# Patient Record
Sex: Female | Born: 2000 | Race: Black or African American | Hispanic: No | Marital: Single | Smoking: Never smoker
Health system: Southern US, Community
[De-identification: ages and names within clinical notes are randomized; demographics above are authoritative.]

## PROBLEM LIST (undated history)

## (undated) DIAGNOSIS — G43909 Migraine, unspecified, not intractable, without status migrainosus: Secondary | ICD-10-CM

## (undated) HISTORY — PX: NO PAST SURGERIES: SHX2092

## (undated) HISTORY — PX: MOUTH SURGERY: SHX715

## (undated) HISTORY — DX: Migraine, unspecified, not intractable, without status migrainosus: G43.909

---
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
## 2020-01-18 ENCOUNTER — Other Ambulatory Visit: Payer: Self-pay

## 2020-01-18 ENCOUNTER — Ambulatory Visit (HOSPITAL_COMMUNITY)
Admission: EM | Admit: 2020-01-18 | Discharge: 2020-01-18 | Disposition: A | Discharge status: Home / Self Care | Payer: Self-pay | Attending: Family Medicine | Admitting: Family Medicine

## 2020-01-18 ENCOUNTER — Encounter (HOSPITAL_COMMUNITY): Payer: Self-pay

## 2020-01-18 DIAGNOSIS — S01511A Laceration without foreign body of lip, initial encounter: Secondary | ICD-10-CM

## 2020-01-18 DIAGNOSIS — S0993XA Unspecified injury of face, initial encounter: Secondary | ICD-10-CM

## 2020-01-18 MED ORDER — PENICILLIN V POTASSIUM 500 MG PO TABS: 500.0000 mg | ORAL_TABLET | Freq: Four times a day (QID) | ORAL | 0 refills | Status: AC

## 2020-01-18 MED ORDER — TRAMADOL-ACETAMINOPHEN 37.5-325 MG PO TABS: 1.0000 | ORAL_TABLET | Freq: Four times a day (QID) | ORAL | 0 refills | Status: DC | PRN

## 2020-01-18 NOTE — Discharge Instructions (Signed)
Continue salt water gargles Consider anbesol for teething pain ( numbing gel)  Take the antibiotic 4 times a day until dentist tells you to stop Take the pain medicine as needed Do not drive on the [place]
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
## 2020-03-29 ENCOUNTER — Telehealth (HOSPITAL_COMMUNITY): Payer: Self-pay | Admitting: Emergency Medicine

## 2020-03-29 ENCOUNTER — Encounter (HOSPITAL_COMMUNITY): Payer: Self-pay | Admitting: Emergency Medicine

## 2020-03-29 ENCOUNTER — Other Ambulatory Visit: Payer: Self-pay

## 2020-03-29 ENCOUNTER — Ambulatory Visit (HOSPITAL_COMMUNITY)
Admission: EM | Admit: 2020-03-29 | Discharge: 2020-03-29 | Disposition: A | Discharge status: Home / Self Care | Payer: Medicaid Other | Attending: Family Medicine | Admitting: Family Medicine

## 2020-03-29 DIAGNOSIS — J069 Acute upper respiratory infection, unspecified: Secondary | ICD-10-CM

## 2020-03-29 MED ORDER — AZITHROMYCIN 250 MG PO TABS: 250.0000 mg | ORAL_TABLET | Freq: Every day | ORAL | 0 refills | Status: AC

## 2020-03-29 MED ORDER — FLUTICASONE PROPIONATE 50 MCG/ACT NA SUSP
2.0000 | Freq: Every day | NASAL | 0 refills | Status: DC
Start: 2020-03-29 — End: 2023-10-03

## 2020-03-29 NOTE — ED Triage Notes (Signed)
Onset of symptoms 3 days ago.  Symptoms consist of itchy throat, ears stuffy, eyes watery, headache-headache is on right side of head and behind right eye.  Patient has a productive cough, yellow phlegm  Denies fever

## 2020-03-29 NOTE — ED Notes (Signed)
Computer/epic glitch

## 2020-03-29 NOTE — Discharge Instructions (Signed)
Please try the flonase initially.  If your symptoms fail to improve then try the azithromycin  Please try the zyrtec as well.  Please follow up if your symptoms fail to improve.

## 2020-03-29 NOTE — ED Provider Notes (Signed)
[facility]    CSN: [id number] Arrival date & time: 03/29/20  1652      History   Chief Complaint Chief Complaint  Patient presents with  . URI    HPI Katie Le is a 19 y.o. female.   Presenting with bilateral ear pain and itchy watery eyes and sinus congestion.  Symptoms been ongoing for a few days.  Feels different than her normal allergies.  Has not been running with been sick.  No fevers.  HPI  History reviewed. No pertinent past medical history.  Patient Active Problem List   Diagnosis Date Noted  . Tension headache 06/09/2018  . Migraine without aura and without status migrainosus, not intractable 06/09/2018  . Allergic urticaria 08/02/2016  . Allergic rhinoconjunctivitis 08/02/2016    Past Surgical History:  Procedure Laterality Date  . MOUTH SURGERY    . NO PAST SURGERIES      OB History   No obstetric history on file.      Home Medications    Prior to Admission medications   Medication Sig Start Date End Date Taking? Authorizing Provider  cetirizine (ZYRTEC) 10 MG tablet Take one tablet one to two times per day as directed. 05/27/18  Yes Wieters, Hallie C, PA-C  guaiFENesin (MUCINEX) 600 MG 12 hr tablet Take by mouth 2 (two) times daily.   Yes [provider]  azithromycin (ZITHROMAX) 250 MG tablet Take 1 tablet (250 mg total) by mouth daily. Take first 2 tablets together, then 1 every day until finished. 03/29/20   Myra Rude, MD  EPINEPHrine 0.3 mg/0.3 mL IJ SOAJ injection Use as directed for life-threatening allergic reaction. 11/13/16   Marcelyn Bruins, MD  fluticasone (FLONASE) 50 MCG/ACT nasal spray Place 2 sprays into both nostrils daily. 03/29/20 03/29/21  Myra Rude, MD  montelukast (SINGULAIR) 10 MG tablet TK 1 T PO QD 11/13/16   [provider]  traMADol-acetaminophen (ULTRACET) 37.5-325 MG tablet Take 1-2 tablets by mouth every 6 (six) hours as needed. 01/18/20   Eustace Moore, MD  topiramate  (TOPAMAX) 50 MG tablet Take 1 tablet (50 mg total) by mouth at bedtime. (Start with half a tablet every night for the first week) 06/09/18 01/18/20  Keturah Shavers, MD    Family History Family History  Problem Relation Age of Onset  . Eczema Father   . Urticaria Father   . Asthma Maternal Grandmother   . Allergic rhinitis Mother   . Migraines Mother   . Angioedema Neg Hx   . Immunodeficiency Neg Hx     Social History Social History   Tobacco Use  . Smoking status: Never Smoker  . Smokeless tobacco: Never Used  Vaping Use  . Vaping Use: Never used  Substance Use Topics  . Alcohol use: Yes  . Drug use: Yes    Types: Marijuana     Allergies   Patient has no known allergies.   Review of Systems Review of Systems  See HPI  Physical Exam Triage Vital Signs ED Triage Vitals  Enc Vitals Group     BP 03/29/20 1750 116/81     Pulse Rate 03/29/20 1750 77     Resp 03/29/20 1750 18     Temp 03/29/20 1750 98.2 F (36.8 C)     Temp src --      SpO2 03/29/20 1750 97 %     Weight --      Height --      Head Circumference --  Peak Flow --      Pain Score 03/29/20 1728 7     Pain Loc --      Pain Edu? --      Excl. in [place]? --    No data found.  Updated Vital Signs BP 116/81 (BP Location: Right Arm)   Pulse 77   Temp 98.2 F (36.8 C)   Resp 18   SpO2 97%   Visual Acuity Right Eye Distance:   Left Eye Distance:   Bilateral Distance:    Right Eye Near:   Left Eye Near:    Bilateral Near:     Physical Exam Gen: NAD, alert, cooperative with exam, well-appearing ENT: normal lips, normal nasal mucosa, tympanic membranes clear and intact bilaterally, normal oropharynx, no cervical lymphadenopathy Eye: normal EOM, normal conjunctiva and lids CV:  +2 pedal pulses, regular rate and rhythm, S1-S2   Resp: no accessory muscle use, non-labored, clear to auscultation bilaterally, Psych:  normal insight, alert and oriented MSK: Normal gait, normal strength    UC  Treatments / Results  Labs (all labs ordered are listed, but only abnormal results are displayed) Labs Reviewed - No data to display  EKG   Radiology No results found.  Procedures Procedures (including critical care time)  Medications Ordered in UC Medications - No data to display  Initial Impression / Assessment and Plan / UC Course  I have reviewed the triage vital signs and the nursing notes.  Pertinent labs & imaging results that were available during my care of the patient were reviewed by me and considered in my medical decision making (see chart for details).     Katie Le is an 19 year old female is presenting with symptoms suggestive of upper respiratory infection.  Friend with Flonase and counseled on supportive care.  Given an azithromycin if symptoms fail to improve after few days.  Given indications to follow-up.  Final Clinical Impressions(s) / UC Diagnoses   Final diagnoses:  Upper respiratory tract infection, unspecified type     Discharge Instructions     Please try the flonase initially.  If your symptoms fail to improve then try the azithromycin  Please try the zyrtec as well.  Please follow up if your symptoms fail to improve.     ED Prescriptions    Medication Sig Dispense Auth. Provider   fluticasone (FLONASE) 50 MCG/ACT nasal spray Place 2 sprays into both nostrils daily. 1 g Myra Rude, MD   azithromycin (ZITHROMAX) 250 MG tablet Take 1 tablet (250 mg total) by mouth daily. Take first 2 tablets together, then 1 every day until finished. 6 tablet Myra Rude, MD     PDMP not reviewed this encounter.   Myra Rude, MD 03/29/20 [phone]
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
## 2020-04-01 ENCOUNTER — Ambulatory Visit (INDEPENDENT_AMBULATORY_CARE_PROVIDER_SITE_OTHER): Payer: Medicaid Other

## 2020-04-01 ENCOUNTER — Encounter (HOSPITAL_COMMUNITY): Payer: Self-pay

## 2020-04-01 ENCOUNTER — Ambulatory Visit (HOSPITAL_COMMUNITY)
Admission: EM | Admit: 2020-04-01 | Discharge: 2020-04-01 | Disposition: A | Discharge status: Home / Self Care | Payer: Medicaid Other | Attending: Family Medicine | Admitting: Family Medicine

## 2020-04-01 ENCOUNTER — Other Ambulatory Visit: Payer: Self-pay

## 2020-04-01 DIAGNOSIS — R109 Unspecified abdominal pain: Secondary | ICD-10-CM

## 2020-04-01 DIAGNOSIS — R05 Cough: Secondary | ICD-10-CM

## 2020-04-01 DIAGNOSIS — Z3202 Encounter for pregnancy test, result negative: Secondary | ICD-10-CM | POA: Diagnosis not present

## 2020-04-01 LAB — POCT URINALYSIS DIP (DEVICE)
Bilirubin Urine: NEGATIVE
Glucose, UA: NEGATIVE mg/dL
Hgb urine dipstick: NEGATIVE
Ketones, ur: NEGATIVE mg/dL
Nitrite: NEGATIVE
Protein, ur: NEGATIVE mg/dL
Specific Gravity, Urine: 1.025 (ref 1.005–1.030)
Urobilinogen, UA: 0.2 mg/dL (ref 0.0–1.0)
pH: 6.5 (ref 5.0–8.0)

## 2020-04-01 LAB — POC URINE PREG, ED: Preg Test, Ur: NEGATIVE

## 2020-04-01 MED ORDER — NAPROXEN 500 MG PO TABS
500.0000 mg | ORAL_TABLET | Freq: Two times a day (BID) | ORAL | 0 refills | Status: AC
Start: 2020-04-01 — End: ?

## 2020-04-01 MED ORDER — PSEUDOEPH-BROMPHEN-DM 30-2-10 MG/5ML PO SYRP
5.0000 mL | ORAL_SOLUTION | Freq: Four times a day (QID) | ORAL | 0 refills | Status: DC | PRN
Start: 2020-04-01 — End: 2021-06-14

## 2020-04-01 NOTE — Discharge Instructions (Addendum)
Xray normal, urine normal I suspect pain likely muscle straining/inflammation from frequent coughing May try cough syrup as needed to help with cough Naprosyn twice daily for pain Continue to monitor and follow up if not improving or worsening

## 2020-04-01 NOTE — ED Provider Notes (Signed)
[facility]    CSN: [id number] Arrival date & time: 04/01/20  1038      History   Chief Complaint Chief Complaint  Patient presents with  . Flank Pain    HPI Katie Le is a 19 y.o. female no significant past medical history presenting today for evaluation of right side pain.  Patient reports that beginning yesterday she began to have discomfort in her side.  Has had increased pain with certain movements, positions as well as inspiration.  She has had URI symptoms x1 week with associated cough.  Symptoms worse with cough, denies recent worsening of URI symptoms or cough.  Denies fevers.  She denies abdominal pain nausea or vomiting.  Denies any injury or trauma.  Patient expresses concern over needing possible x-ray.  HPI  History reviewed. No pertinent past medical history.  Patient Active Problem List   Diagnosis Date Noted  . Tension headache 06/09/2018  . Migraine without aura and without status migrainosus, not intractable 06/09/2018  . Allergic urticaria 08/02/2016  . Allergic rhinoconjunctivitis 08/02/2016    Past Surgical History:  Procedure Laterality Date  . MOUTH SURGERY    . NO PAST SURGERIES      OB History   No obstetric history on file.      Home Medications    Prior to Admission medications   Medication Sig Start Date End Date Taking? Authorizing Provider  azithromycin (ZITHROMAX) 250 MG tablet Take 1 tablet (250 mg total) by mouth daily. Take first 2 tablets together, then 1 every day until finished. 03/29/20   Myra Rude, MD  brompheniramine-pseudoephedrine-DM 30-2-10 MG/5ML syrup Take 5 mLs by mouth 4 (four) times daily as needed (cough/congestion). 04/01/20   Nash Bolls C, PA-C  cetirizine (ZYRTEC) 10 MG tablet Take one tablet one to two times per day as directed. 05/27/18   Theia Dezeeuw C, PA-C  EPINEPHrine 0.3 mg/0.3 mL IJ SOAJ injection Use as directed for life-threatening allergic reaction. 11/13/16   Marcelyn Bruins, MD  fluticasone (FLONASE) 50 MCG/ACT nasal spray Place 2 sprays into both nostrils daily. 03/29/20 03/29/21  Myra Rude, MD  guaiFENesin (MUCINEX) 600 MG 12 hr tablet Take by mouth 2 (two) times daily.    [provider]  montelukast (SINGULAIR) 10 MG tablet TK 1 T PO QD 11/13/16   [provider]  naproxen (NAPROSYN) 500 MG tablet Take 1 tablet (500 mg total) by mouth 2 (two) times daily. 04/01/20   Tilly Pernice C, PA-C  traMADol-acetaminophen (ULTRACET) 37.5-325 MG tablet Take 1-2 tablets by mouth every 6 (six) hours as needed. 01/18/20   Eustace Moore, MD  topiramate (TOPAMAX) 50 MG tablet Take 1 tablet (50 mg total) by mouth at bedtime. (Start with half a tablet every night for the first week) 06/09/18 01/18/20  Keturah Shavers, MD    Family History Family History  Problem Relation Age of Onset  . Eczema Father   . Urticaria Father   . Asthma Maternal Grandmother   . Allergic rhinitis Mother   . Migraines Mother   . Angioedema Neg Hx   . Immunodeficiency Neg Hx     Social History Social History   Tobacco Use  . Smoking status: Never Smoker  . Smokeless tobacco: Never Used  Vaping Use  . Vaping Use: Never used  Substance Use Topics  . Alcohol use: Yes  . Drug use: Yes    Types: Marijuana     Allergies   Patient has no  known allergies.   Review of Systems Review of Systems  Constitutional: Negative for fever.  Respiratory: Positive for cough. Negative for shortness of breath.   Cardiovascular: Negative for chest pain.  Gastrointestinal: Negative for abdominal pain, diarrhea, nausea and vomiting.  Genitourinary: Positive for flank pain. Negative for dysuria, frequency, genital sores, hematuria, menstrual problem, vaginal bleeding, vaginal discharge and vaginal pain.  Musculoskeletal: Negative for back pain.  Skin: Negative for rash.  Neurological: Negative for dizziness, light-headedness and headaches.     Physical Exam Triage  Vital Signs ED Triage Vitals  Enc Vitals Group     BP 04/01/20 1127 128/80     Pulse Rate 04/01/20 1127 78     Resp 04/01/20 1127 18     Temp 04/01/20 1127 98.8 F (37.1 C)     Temp src --      SpO2 04/01/20 1127 97 %     Weight --      Height --      Head Circumference --      Peak Flow --      Pain Score 04/01/20 1125 10     Pain Loc --      Pain Edu? --      Excl. in GC? --    No data found.  Updated Vital Signs BP 128/80   Pulse 78   Temp 98.8 F (37.1 C)   Resp 18   LMP 03/18/2020   SpO2 97%   Visual Acuity Right Eye Distance:   Left Eye Distance:   Bilateral Distance:    Right Eye Near:   Left Eye Near:    Bilateral Near:     Physical Exam Vitals and nursing note reviewed.  Constitutional:      Appearance: She is well-developed.     Comments: No acute distress  HENT:     Head: Normocephalic and atraumatic.     Ears:     Comments: Bilateral ears without tenderness to palpation of external auricle, tragus and mastoid, EAC's without erythema or swelling, TM's with good bony landmarks and cone of light. Non erythematous.    Nose: Nose normal.     Mouth/Throat:     Comments: Oral mucosa pink and moist, no tonsillar enlargement or exudate. Posterior pharynx patent and nonerythematous, no uvula deviation or swelling. Normal phonation. Eyes:     Conjunctiva/sclera: Conjunctivae normal.  Cardiovascular:     Rate and Rhythm: Normal rate.  Pulmonary:     Effort: Pulmonary effort is normal. No respiratory distress.     Comments: Breathing comfortably at rest, CTABL, no wheezing, rales or other adventitious sounds auscultated Abdominal:     General: There is no distension.     Comments: Soft, nondistended, nontender to light and deep palpation to epigastrium in all 4 quadrants, tenderness to palpation to right lower flank  Musculoskeletal:        General: Normal range of motion.     Cervical back: Neck supple.     Comments: Lower thoracic area on right with  tenderness to palpation  Skin:    General: Skin is warm and dry.  Neurological:     Mental Status: She is alert and oriented to person, place, and time.      UC Treatments / Results  Labs (all labs ordered are listed, but only abnormal results are displayed) Labs Reviewed  POCT URINALYSIS DIP (DEVICE) - Abnormal; Notable for the following components:      Result Value   Leukocytes,Ua TRACE (*)  All other components within normal limits  POC URINE PREG, ED    EKG   Radiology No results found.  Procedures Procedures (including critical care time)  Medications Ordered in UC Medications - No data to display  Initial Impression / Assessment and Plan / UC Course  I have reviewed the triage vital signs and the nursing notes.  Pertinent labs & imaging results that were available during my care of the patient were reviewed by me and considered in my medical decision making (see chart for details).     Suspect likely muscular etiology of pain secondary to recent cough.  Obtaining chest x-ray to rule out underlying pneumonia given URI symptoms x1 week.  Patient expresses concern over possible fluid causing pain.  Discussed x-raying side not likely going to show this and I have low suspicion of this without trauma and no significant past medical history.  As if she develops more abdominal pain nausea/vomiting or issues with eating associated with pain to follow-up in emergency room for further imaging.  Recommending anti-inflammatories and treatment of cough.  Discussed strict return precautions. Patient verbalized understanding and is agreeable with plan.  Final Clinical Impressions(s) / UC Diagnoses   Final diagnoses:  Right flank pain     Discharge Instructions     Xray normal, urine normal I suspect pain likely muscle straining/inflammation from frequent coughing May try cough syrup as needed to help with cough Naprosyn twice daily for pain Continue to monitor and  follow up if not improving or worsening    ED Prescriptions    Medication Sig Dispense Auth. Provider   brompheniramine-pseudoephedrine-DM 30-2-10 MG/5ML syrup Take 5 mLs by mouth 4 (four) times daily as needed (cough/congestion). 120 mL Bintou Lafata C, PA-C   naproxen (NAPROSYN) 500 MG tablet Take 1 tablet (500 mg total) by mouth 2 (two) times daily. 30 tablet Kerryann Allaire, Ewing C, PA-C     PDMP not reviewed this encounter.   Sharyon Cable [place] C, PA-C 04/01/20 1256

## 2020-04-01 NOTE — ED Triage Notes (Signed)
Pt presents with complaints of right side/flank pain that started yesterday. Denies any urinary symptoms. Pt was seen and treated here 3 days ago for an URI. PT has had a cough prior to the pain starting.

## 2020-04-06 ENCOUNTER — Telehealth: Payer: Self-pay | Admitting: *Deleted

## 2020-04-06 NOTE — Telephone Encounter (Signed)
Patient returned Emmi call, states she does not need assistance at this time.

## 2021-01-26 ENCOUNTER — Encounter (INDEPENDENT_AMBULATORY_CARE_PROVIDER_SITE_OTHER): Payer: Self-pay

## 2021-06-14 ENCOUNTER — Encounter (HOSPITAL_COMMUNITY): Payer: Self-pay | Admitting: Emergency Medicine

## 2021-06-14 ENCOUNTER — Ambulatory Visit (HOSPITAL_COMMUNITY)
Admission: EM | Admit: 2021-06-14 | Discharge: 2021-06-14 | Disposition: A | Discharge status: Home / Self Care | Payer: Medicaid Other | Attending: Family Medicine | Admitting: Family Medicine

## 2021-06-14 ENCOUNTER — Other Ambulatory Visit: Payer: Self-pay

## 2021-06-14 DIAGNOSIS — Z20822 Contact with and (suspected) exposure to covid-19: Secondary | ICD-10-CM | POA: Insufficient documentation

## 2021-06-14 DIAGNOSIS — R0981 Nasal congestion: Secondary | ICD-10-CM | POA: Diagnosis present

## 2021-06-14 DIAGNOSIS — J069 Acute upper respiratory infection, unspecified: Secondary | ICD-10-CM

## 2021-06-14 MED ORDER — PROMETHAZINE-DM 6.25-15 MG/5ML PO SYRP
5.0000 mL | ORAL_SOLUTION | Freq: Four times a day (QID) | ORAL | 0 refills | Status: DC | PRN
Start: 2021-06-14 — End: 2023-10-03

## 2021-06-14 NOTE — Discharge Instructions (Signed)
You have been tested for COVID-19 today. °If your test returns positive, you will receive a phone call from [facility] regarding your results. °Negative test results are not called. °Both positive and negative results area always visible on MyChart. °If you do not have a MyChart account, sign up instructions are provided in your discharge papers. °Please do not hesitate to contact us should you have questions or concerns. ° °

## 2021-06-14 NOTE — ED Triage Notes (Signed)
Patient c/o sore throat, nasal congestion, and productive cough w/ "yellow to green" mucus x 2-3 days.   Patient denies fever.   Patient endorses lightheadedness at times.    Patient request COVID testing.   Patient hasn't taken any medications for symptoms.   History of Seasonal Allergies.

## 2021-06-15 LAB — SARS CORONAVIRUS 2 (TAT 6-24 HRS): SARS Coronavirus 2: NEGATIVE

## 2021-06-15 IMAGING — DX DG CHEST 2V
2 series · 2 of 2 positions shown · non-contrast
Comparison: August 20, 2009

CLINICAL DATA: Cough for a week.  Right-sided pain.

EXAM:
CHEST - 2 VIEW

[chest pa]
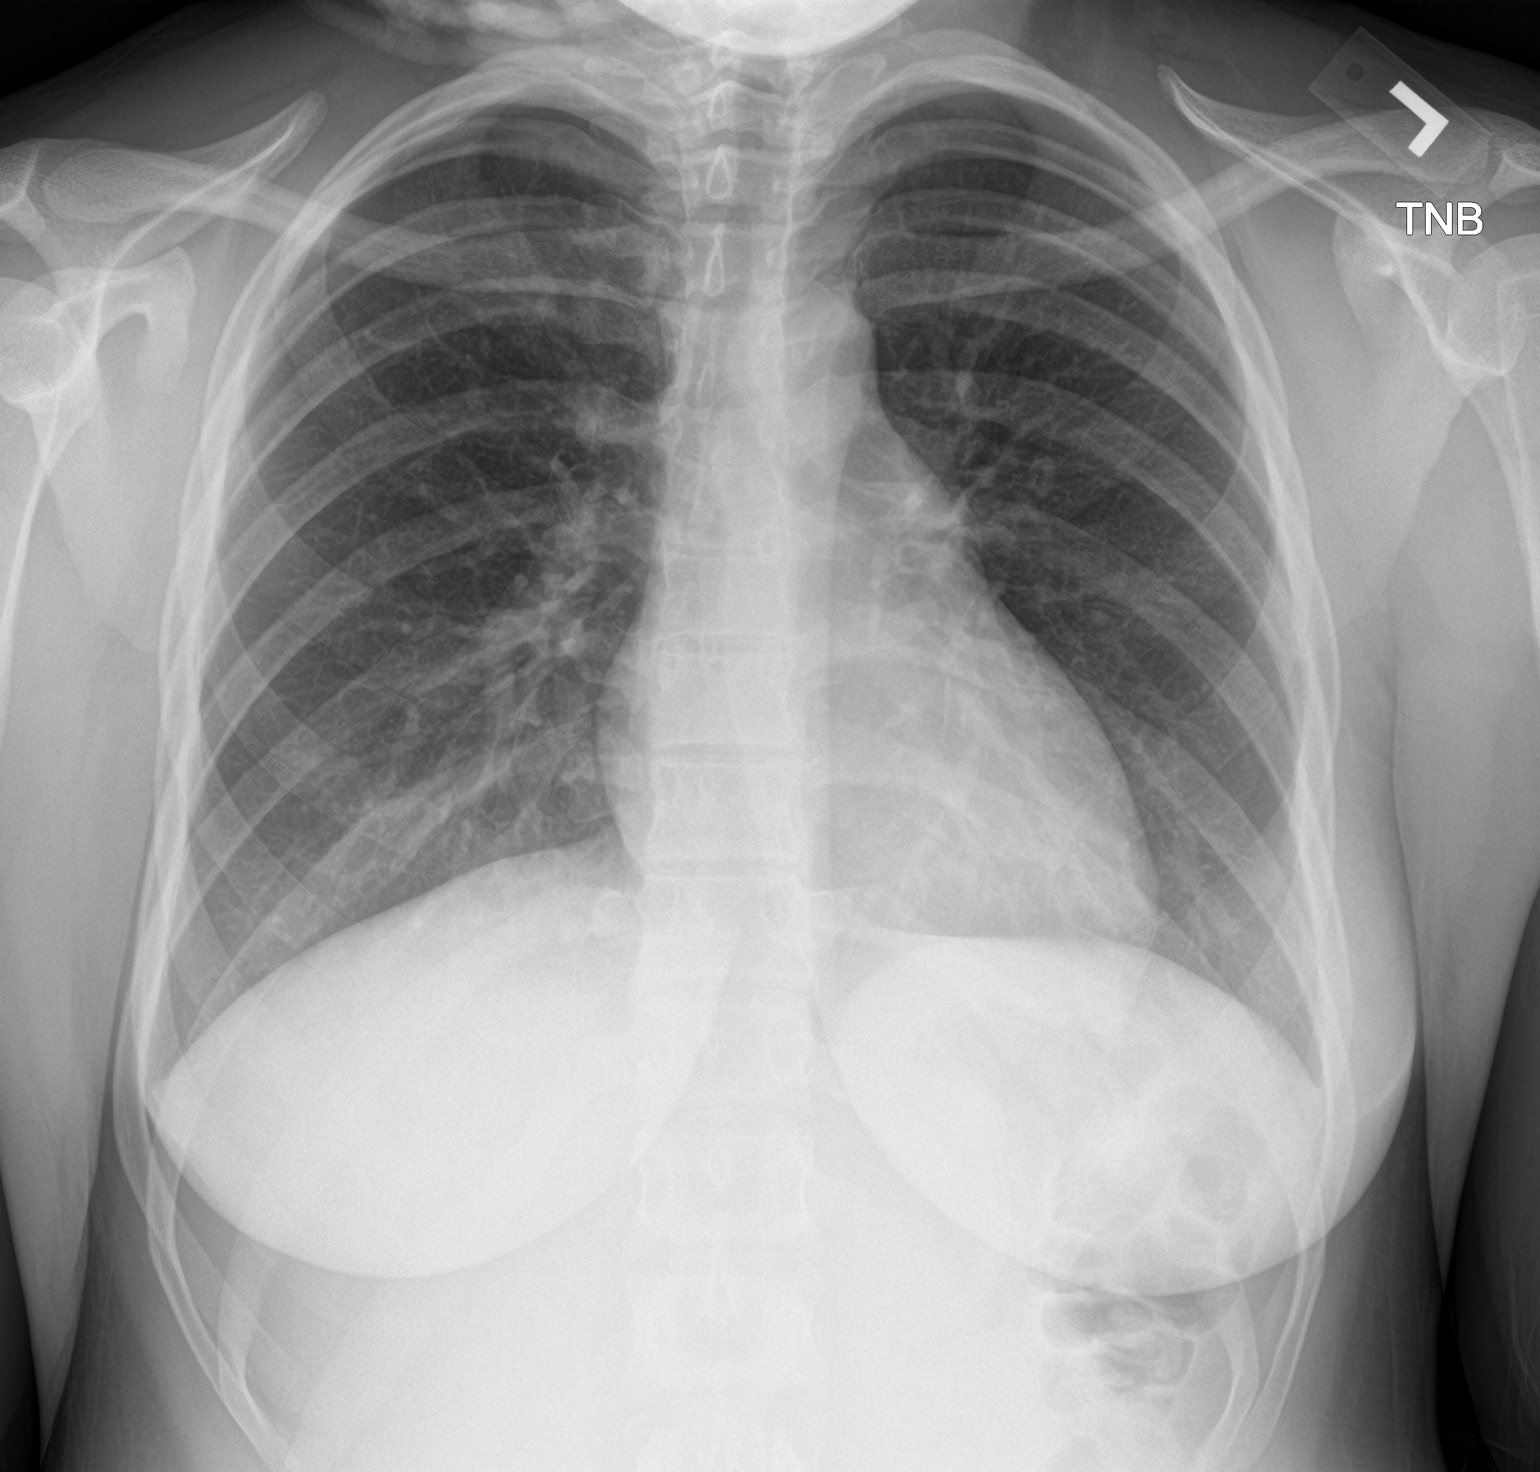

[chest lat]
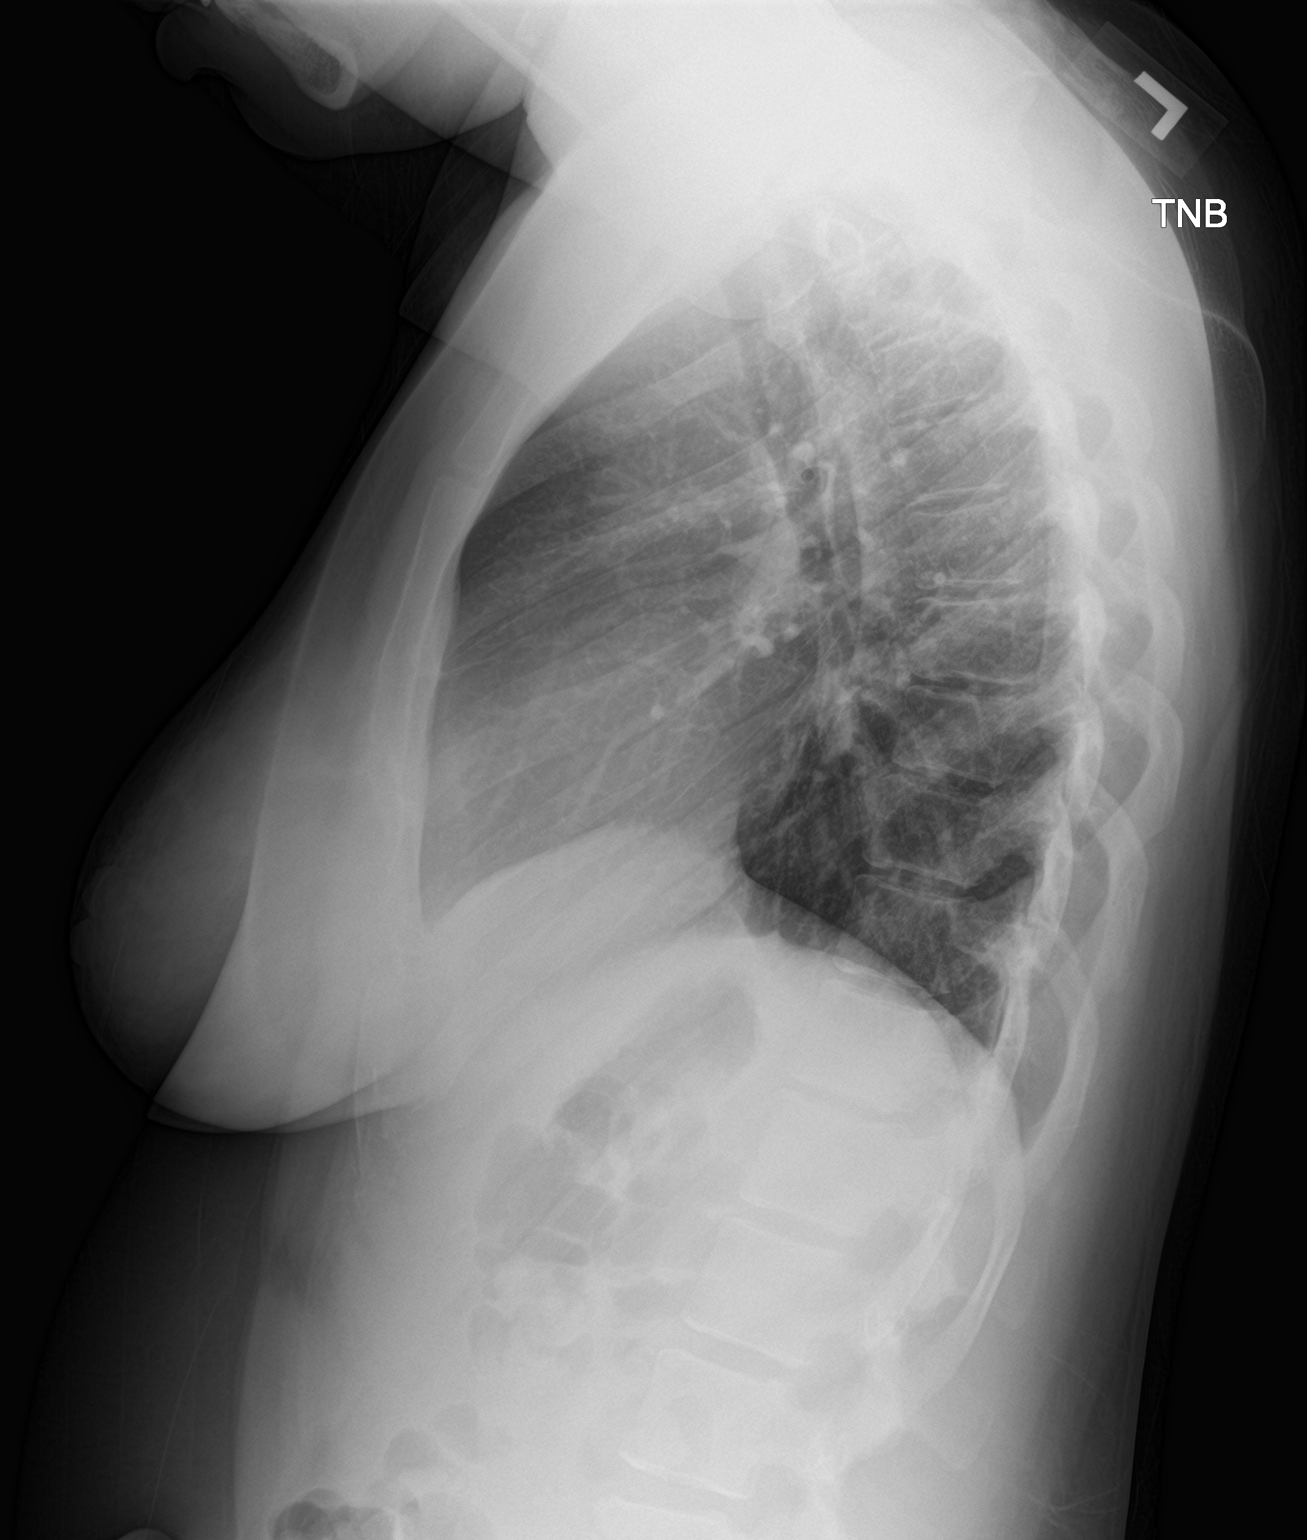

[2 of 2 positions shown; findings below may reference images not displayed]

FINDINGS: The heart size and mediastinal contours are within normal limits.
Both lungs are clear. The visualized skeletal structures are
unremarkable.
IMPRESSION: No active cardiopulmonary disease.

## 2021-06-17 NOTE — ED Provider Notes (Signed)
[facility]   [id number] 06/14/21 Arrival Time: 1733  ASSESSMENT & PLAN:  1. Viral URI with cough    Discussed typical duration of viral illnesses. COVID-19 testing sent. OTC symptom care as needed.  Meds ordered this encounter  Medications   promethazine-dextromethorphan (PROMETHAZINE-DM) 6.25-15 MG/5ML syrup    Sig: Take 5 mLs by mouth 4 (four) times daily as needed for cough.    Dispense:  118 mL    Refill:  0     Follow-up Information     Berline Lopes, MD.   Specialty: Pediatrics Why: As needed. Contact information: [address] [place] [address] [phone]                 Reviewed expectations re: course of current medical issues. Questions answered. Outlined signs and symptoms indicating need for more acute intervention. Understanding verbalized. After Visit Summary given.   SUBJECTIVE: History from: patient. Katie Le is a 20 y.o. female who presents with worries regarding COVID-19. Known COVID-19 contact: none. Recent travel: none. Reports: ST, nasal congestion, cough; x 2-3 d. Denies: difficulty breathing. Normal PO intake without n/v/d.  OBJECTIVE:  Vitals:   06/14/21 1835  BP: 121/68  Pulse: 63  Resp: 16  Temp: 98.7 F (37.1 C)  TempSrc: Oral  SpO2: 97%    General appearance: alert; no distress Eyes: PERRLA; EOMI; conjunctiva normal HENT: [place]; AT; with nasal congestion Neck: supple  Lungs: speaks full sentences without difficulty; unlabored; dry cough; no wheezing Extremities: no edema Skin: warm and dry Neurologic: normal gait Psychological: alert and cooperative; normal mood and affect  Labs: Results for orders placed or performed during the hospital encounter of 06/14/21  SARS CORONAVIRUS 2 (TAT 6-24 HRS) Nasopharyngeal Nasopharyngeal Swab   Specimen: Nasopharyngeal Swab  Result Value Ref Range   SARS Coronavirus 2 NEGATIVE NEGATIVE   Labs Reviewed  SARS CORONAVIRUS 2 (TAT 6-24 HRS)     No Known Allergies  History reviewed. No pertinent past medical history. Social History   Socioeconomic History   Marital status: Single    Spouse name: Not on file   Number of children: Not on file   Years of education: Not on file   Highest education level: Not on file  Occupational History   Not on file  Tobacco Use   Smoking status: Never   Smokeless tobacco: Never  Vaping Use   Vaping Use: Never used  Substance and Sexual Activity   Alcohol use: Yes   Drug use: Yes    Types: Marijuana   Sexual activity: Not on file  Other Topics Concern   Not on file  Social History Narrative   Katie Le is a 12th grade student at [employer]; she does well in school. She lives with her mother and brother. She enjoys driving, eating, and listening to music.    Social Determinants of Health   Financial Resource Strain: Not on file  Food Insecurity: Not on file  Transportation Needs: Not on file  Physical Activity: Not on file  Stress: Not on file  Social Connections: Not on file  Intimate Partner Violence: Not on file   Family History  Problem Relation Age of Onset   Eczema Father    Urticaria Father    Asthma Maternal Grandmother    Allergic rhinitis Mother    Migraines Mother    Angioedema Neg Hx    Immunodeficiency Neg Hx    Past Surgical History:  Procedure Laterality Date   MOUTH  SURGERY     NO PAST SURGERIES       Mardella Layman, MD 06/17/21 1040

## 2023-06-25 ENCOUNTER — Other Ambulatory Visit: Payer: Self-pay

## 2023-06-25 ENCOUNTER — Emergency Department (HOSPITAL_BASED_OUTPATIENT_CLINIC_OR_DEPARTMENT_OTHER)
Admission: EM | Admit: 2023-06-25 | Discharge: 2023-06-25 | Disposition: A | Discharge status: Home / Self Care | Payer: Medicaid Other | Attending: Emergency Medicine | Admitting: Emergency Medicine

## 2023-06-25 ENCOUNTER — Encounter (HOSPITAL_BASED_OUTPATIENT_CLINIC_OR_DEPARTMENT_OTHER): Payer: Self-pay

## 2023-06-25 DIAGNOSIS — S134XXA Sprain of ligaments of cervical spine, initial encounter: Secondary | ICD-10-CM | POA: Diagnosis not present

## 2023-06-25 DIAGNOSIS — M545 Low back pain, unspecified: Secondary | ICD-10-CM | POA: Diagnosis not present

## 2023-06-25 DIAGNOSIS — Y92410 Unspecified street and highway as the place of occurrence of the external cause: Secondary | ICD-10-CM | POA: Insufficient documentation

## 2023-06-25 DIAGNOSIS — M542 Cervicalgia: Secondary | ICD-10-CM | POA: Diagnosis present

## 2023-06-25 MED ORDER — KETOROLAC TROMETHAMINE 60 MG/2ML IM SOLN
30.0000 mg | Freq: Once | INTRAMUSCULAR | Status: DC
  Filled 2023-06-25: qty 2

## 2023-06-25 MED ORDER — ACETAMINOPHEN 500 MG PO TABS
1000.0000 mg | ORAL_TABLET | Freq: Once | ORAL | Status: AC
  Administered 2023-06-25: 1000 mg via ORAL
  Filled 2023-06-25: qty 2
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
## 2023-09-12 ENCOUNTER — Emergency Department (HOSPITAL_BASED_OUTPATIENT_CLINIC_OR_DEPARTMENT_OTHER)
Admission: EM | Admit: 2023-09-12 | Discharge: 2023-09-12 | Disposition: A | Discharge status: Home / Self Care | Payer: Medicaid Other | Attending: Emergency Medicine | Admitting: Emergency Medicine

## 2023-09-12 ENCOUNTER — Encounter (HOSPITAL_BASED_OUTPATIENT_CLINIC_OR_DEPARTMENT_OTHER): Payer: Self-pay | Admitting: Emergency Medicine

## 2023-09-12 ENCOUNTER — Other Ambulatory Visit: Payer: Self-pay

## 2023-09-12 DIAGNOSIS — G43809 Other migraine, not intractable, without status migrainosus: Secondary | ICD-10-CM | POA: Diagnosis not present

## 2023-09-12 DIAGNOSIS — Z79899 Other long term (current) drug therapy: Secondary | ICD-10-CM | POA: Diagnosis not present

## 2023-09-12 DIAGNOSIS — R519 Headache, unspecified: Secondary | ICD-10-CM | POA: Diagnosis present

## 2023-09-12 LAB — CBC WITH DIFFERENTIAL/PLATELET
Abs Immature Granulocytes: 0.02 10*3/uL (ref 0.00–0.07)
Basophils Absolute: 0.1 10*3/uL (ref 0.0–0.1)
Basophils Relative: 1 %
Eosinophils Absolute: 0.2 10*3/uL (ref 0.0–0.5)
Eosinophils Relative: 2 %
HCT: 39.5 % (ref 36.0–46.0)
Hemoglobin: 12.7 g/dL (ref 12.0–15.0)
Immature Granulocytes: 0 %
Lymphocytes Relative: 25 %
Lymphs Abs: 2.6 10*3/uL (ref 0.7–4.0)
MCH: 28.6 pg (ref 26.0–34.0)
MCHC: 32.2 g/dL (ref 30.0–36.0)
MCV: 89 fL (ref 80.0–100.0)
Monocytes Absolute: 0.8 10*3/uL (ref 0.1–1.0)
Monocytes Relative: 7 %
Neutro Abs: 6.8 10*3/uL (ref 1.7–7.7)
Neutrophils Relative %: 65 %
Platelets: 397 10*3/uL (ref 150–400)
RBC: 4.44 MIL/uL (ref 3.87–5.11)
RDW: 13.1 % (ref 11.5–15.5)
WBC: 10.4 10*3/uL (ref 4.0–10.5)
nRBC: 0 % (ref 0.0–0.2)

## 2023-09-12 LAB — BASIC METABOLIC PANEL
Anion gap: 4 — ABNORMAL LOW (ref 5–15)
BUN: 12 mg/dL (ref 6–20)
CO2: 24 mmol/L (ref 22–32)
Calcium: 8.5 mg/dL — ABNORMAL LOW (ref 8.9–10.3)
Chloride: 108 mmol/L (ref 98–111)
Creatinine, Ser: 0.92 mg/dL (ref 0.44–1.00)
GFR, Estimated: >60 mL/min (ref >60)
Glucose, Bld: 97 mg/dL (ref 70–99)
Potassium: 3.8 mmol/L (ref 3.5–5.1)
Sodium: 136 mmol/L (ref 135–145)

## 2023-09-12 MED ORDER — KETOROLAC TROMETHAMINE 15 MG/ML IJ SOLN
15.0000 mg | Freq: Once | INTRAMUSCULAR | Status: AC
  Administered 2023-09-12: 15 mg via INTRAVENOUS
  Filled 2023-09-12: qty 1

## 2023-09-12 MED ORDER — METOCLOPRAMIDE HCL 5 MG/ML IJ SOLN
10.0000 mg | Freq: Once | INTRAMUSCULAR | Status: AC
  Administered 2023-09-12: 10 mg via INTRAVENOUS
  Filled 2023-09-12: qty 2

## 2023-09-12 MED ORDER — SODIUM CHLORIDE 0.9 % IV BOLUS
1000.0000 mL | Freq: Once | INTRAVENOUS | Status: AC
  Administered 2023-09-12: 1000 mL via INTRAVENOUS

## 2023-09-12 MED ORDER — DIPHENHYDRAMINE HCL 50 MG/ML IJ SOLN
12.5000 mg | Freq: Once | INTRAMUSCULAR | Status: AC
  Administered 2023-09-12: 12.5 mg via INTRAVENOUS
  Filled 2023-09-12: qty 1

## 2023-09-12 NOTE — ED Notes (Signed)
Pt aware of the need for a urine... Unable to currently provide the sample... 

## 2023-09-12 NOTE — ED Notes (Signed)
Discharge paperwork given and verbally understood. 
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
## 2023-09-12 NOTE — ED Provider Notes (Signed)
[facility] AT [facility] Provider Note   CSN: [id number] Arrival date & time: 09/12/23  0827     History  Chief Complaint  Patient presents with   Headache    Katie Le is a 22 y.o. female.  Pt is a 22 yo female with pmhx significant for migraines.  Pt said she's had a headache intermittently for 3 weeks.  She also said she passed out a few days ago.  She takes topamax for her headaches, but does not think that is helping.  This headache started this am around 0230.  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       Home Medications Prior to Admission medications   Medication Sig Start Date End Date Taking? Authorizing Provider  azithromycin (ZITHROMAX) 250 MG tablet Take 1 tablet (250 mg total) by mouth daily. Take first 2 tablets together, then 1 every day until finished. 03/29/20   Myra Rude, MD  cetirizine (ZYRTEC) 10 MG tablet Take one tablet one to two times per day as directed. 05/27/18   Wieters, Hallie C, PA-C  EPINEPHrine 0.3 mg/0.3 mL IJ SOAJ injection Use as directed for life-threatening allergic reaction. 11/13/16   Marcelyn Bruins, MD  fluticasone (FLONASE) 50 MCG/ACT nasal spray Place 2 sprays into both nostrils daily. 03/29/20 03/29/21  Myra Rude, MD  guaiFENesin (MUCINEX) 600 MG 12 hr tablet Take by mouth 2 (two) times daily.    [provider]  montelukast (SINGULAIR) 10 MG tablet TK 1 T PO QD 11/13/16   [provider]  naproxen (NAPROSYN) 500 MG tablet Take 1 tablet (500 mg total) by mouth 2 (two) times daily. 04/01/20   Wieters, Hallie C, PA-C  promethazine-dextromethorphan (PROMETHAZINE-DM) 6.25-15 MG/5ML syrup Take 5 mLs by mouth 4 (four) times daily as needed for cough. 06/14/21   Mardella Layman, MD  traMADol-acetaminophen (ULTRACET) 37.5-325 MG tablet Take 1-2 tablets by mouth every 6 (six) hours as needed. 01/18/20   Eustace Moore, MD  topiramate (TOPAMAX) 50 MG tablet  Take 1 tablet (50 mg total) by mouth at bedtime. (Start with half a tablet every night for the first week) 06/09/18 01/18/20  Keturah Shavers, MD      Allergies    Patient has no known allergies.    Review of Systems   Review of Systems  Neurological:  Positive for headaches.  All other systems reviewed and are negative.   Physical Exam Updated Vital Signs BP (!) 98/53 (BP Location: Right Arm) Comment: sleeping  Pulse (!) 51   Temp 98 F (36.7 C)   Resp 16   Wt 59 kg   LMP 09/04/2023   SpO2 100%   BMI 23.78 kg/m  Physical Exam Vitals and nursing note reviewed.  Constitutional:      Appearance: She is well-developed.  HENT:     Head: Normocephalic and atraumatic.     Mouth/Throat:     Mouth: Mucous membranes are moist.     Pharynx: Oropharynx is clear.  Eyes:     Extraocular Movements: Extraocular movements intact.     Pupils: Pupils are equal, round, and reactive to light.  Cardiovascular:     Rate and Rhythm: Normal rate and regular rhythm.     Heart sounds: Normal heart sounds.  Pulmonary:     Effort: Pulmonary effort is normal.     Breath sounds: Normal breath sounds.  Abdominal:     General: Bowel sounds are  normal.     Palpations: Abdomen is soft.  Musculoskeletal:        General: Normal range of motion.     Cervical back: Normal range of motion and neck supple.  Skin:    General: Skin is warm and dry.  Neurological:     Mental Status: She is alert and oriented to person, place, and time.  Psychiatric:        Mood and Affect: Mood normal.        Speech: Speech normal.        Behavior: Behavior normal.     ED Results / Procedures / Treatments   Labs (all labs ordered are listed, but only abnormal results are displayed) Labs Reviewed  BASIC METABOLIC PANEL - Abnormal; Notable for the following components:      Result Value   Calcium 8.5 (*)    Anion gap 4 (*)    All other components within normal limits  CBC WITH DIFFERENTIAL/PLATELET  URINALYSIS,  ROUTINE W REFLEX MICROSCOPIC  PREGNANCY, URINE    EKG None  Radiology No results found.  Procedures Procedures    Medications Ordered in ED Medications  ketorolac (TORADOL) 15 MG/ML injection 15 mg (15 mg Intravenous Given 09/12/23 0957)  metoCLOPramide (REGLAN) injection 10 mg (10 mg Intravenous Given 09/12/23 0953)  diphenhydrAMINE (BENADRYL) injection 12.5 mg (12.5 mg Intravenous Given 09/12/23 0959)  sodium chloride 0.9 % bolus 1,000 mL (0 mLs Intravenous Stopped 09/12/23 1112)    ED Course/ Medical Decision Making/ A&P                                 Medical Decision Making Amount and/or Complexity of Data Reviewed Labs: ordered.  Risk Prescription drug management.   This patient presents to the ED for concern of headache, this involves an extensive number of treatment options, and is a complaint that carries with it a high risk of complications and morbidity.  The differential diagnosis includes migraine, electrolyte abn, pregnancy, dehydration   Co morbidities that complicate the patient evaluation  migraines   Additional history obtained:  Additional history obtained from epic chart review External records from outside source obtained and reviewed including family   Lab Tests:  I Ordered, and personally interpreted labs.  The pertinent results include:  cbc nl, bmp nl    Medicines ordered and prescription drug management:  I ordered medication including toradol/benadryl/reglan  for sx  Reevaluation of the patient after these medicines showed that the patient improved I have reviewed the patients home medicines and have made adjustments as needed   Problem List / ED Course:  Migraine:  pt is feeling better.  She is stable for d/c.  Return if worse.  F/u with neurology/pcp   Reevaluation:  After the interventions noted above, I reevaluated the patient and found that they have :improved   Social Determinants of Health:  Lives at  home   Dispostion:  After consideration of the diagnostic results and the patients response to treatment, I feel that the patent would benefit from discharge with outpatient f/u.          Final Clinical Impression(s) / ED Diagnoses Final diagnoses:  Other migraine without status migrainosus, not intractable    Rx / DC Orders ED Discharge Orders          Ordered    Ambulatory referral to Neurology       Comments: An appointment is  requested in approximately: 1 week   09/12/23 1135              Jacalyn Lefevre, MD 09/12/23 1137

## 2023-10-03 ENCOUNTER — Encounter: Payer: Self-pay | Admitting: Neurology

## 2023-10-03 ENCOUNTER — Ambulatory Visit: Payer: Medicaid Other | Admitting: Neurology

## 2023-10-03 VITALS — BP 112/70 | HR 87 | Ht 64.0 in | Wt 146.4 lb

## 2023-10-03 DIAGNOSIS — G43009 Migraine without aura, not intractable, without status migrainosus: Secondary | ICD-10-CM | POA: Diagnosis not present

## 2023-10-03 DIAGNOSIS — R55 Syncope and collapse: Secondary | ICD-10-CM

## 2023-10-03 DIAGNOSIS — H02401 Unspecified ptosis of right eyelid: Secondary | ICD-10-CM | POA: Diagnosis not present

## 2023-10-03 DIAGNOSIS — R51 Headache with orthostatic component, not elsewhere classified: Secondary | ICD-10-CM

## 2023-10-03 DIAGNOSIS — R5383 Other fatigue: Secondary | ICD-10-CM | POA: Diagnosis not present

## 2023-10-03 DIAGNOSIS — R4020 Unspecified coma: Secondary | ICD-10-CM

## 2023-10-03 DIAGNOSIS — R519 Headache, unspecified: Secondary | ICD-10-CM

## 2023-10-03 DIAGNOSIS — H5711 Ocular pain, right eye: Secondary | ICD-10-CM

## 2023-10-03 DIAGNOSIS — H539 Unspecified visual disturbance: Secondary | ICD-10-CM

## 2023-10-03 NOTE — Progress Notes (Signed)
 [facility]    Provider:  Dr Katie Le Requesting Provider: Dean Clarity, MD Primary Care Provider:  Patient, No Pcp Per  CC:  Migraines  HPI:  Katie Le is a 23 y.o. female here as requested by Katie Clarity, MD for migraines. Seen at [place] 09/12/2023 for migraines. has Allergic urticaria; Allergic rhinoconjunctivitis; Tension headache; and Migraine without aura and without status migrainosus, not intractable on their problem list.  Mostly on the right. Pulsating/pounding rhobbig. Can be pressure, hurts to lay on it, positional depending on which way she move her head. Can be on either side based on position. Happens in the morning or evenings. Pressure behind the eye. Her eye waters and droops. Light and sound bothers her, mostly light, nausea, has vomited. She ishaving side effects to the topamax  GI problems, cognitive changes. She had a syncopal event, standing and felt hot, had to sit down, she fell on the table, she felt hot, sweaty, she lost consciousnessness briefy when she came back she threw up. She also had a headache. Started in high school. Mother is here and provides information. She has tried several medications. No migraine aura. For > 6 months she has been having < 14 total headache days a moth of which 6 days are migrainesthat are moderate to severe and can last 8-24 hours or longer, it even hurts to move, worse positionally, vision changes. No medication overuse.   Meds tried > 3 months or contraindicated/side effects: tylenol /ibuprofen  and OTC analgesics, topiramate , amitriptyline, gabapentin,blood pressure meds contraindicated due to hypotension and syncope. Nurtec, ubrelvy, sumatriptan, maxalt, Aimovig contraindicated due to constipation and injectables (Aimovig, Emgality, Ajovy) contraindicated due to vasovagal syncope with injections, Tylenol , benadryl , ibuprofen , toradol , magnesium , naproxen , topamax   Nurtec and the ubrelvy helped.  Reviewed  notes, labs and imaging from outside physicians, which showed: Reviewed notes from ED visit  09/12/2023 Dr. Clarity Dean MD :  Pt is a 23 yo female with pmhx significant for migraines.  Pt said she's had a headache intermittently for 3 weeks.  She also said she passed out a few days ago.  She takes topamax  for her headaches, but does not think that is helping.  This headache started this am around 0230.  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 (No imaging ordered)   Recent Results (from the past 2160 hours)  CBC with Differential/Platelet     Status: None   Collection Time: 09/12/23  9:08 AM  Result Value Ref Range   WBC 10.4 4.0 - 10.5 K/uL   RBC 4.44 3.87 - 5.11 MIL/uL   Hemoglobin 12.7 12.0 - 15.0 g/dL   HCT 60.4 63.9 - 53.9 %   MCV 89.0 80.0 - 100.0 fL   MCH 28.6 26.0 - 34.0 pg   MCHC 32.2 30.0 - 36.0 g/dL   RDW 86.8 88.4 - 84.4 %   Platelets 397 150 - 400 K/uL   nRBC 0.0 0.0 - 0.2 %   Neutrophils Relative % 65 %   Neutro Abs 6.8 1.7 - 7.7 K/uL   Lymphocytes Relative 25 %   Lymphs Abs 2.6 0.7 - 4.0 K/uL   Monocytes Relative 7 %   Monocytes Absolute 0.8 0.1 - 1.0 K/uL   Eosinophils Relative 2 %   Eosinophils Absolute 0.2 0.0 - 0.5 K/uL   Basophils Relative 1 %   Basophils Absolute 0.1 0.0 - 0.1 K/uL   Immature Granulocytes 0 %   Abs Immature Granulocytes 0.02 0.00 -  0.07 K/uL    Comment: Performed at [facility], [address], [place], [place] [address]  Basic metabolic panel     Status: Abnormal   Collection Time: 09/12/23  9:08 AM  Result Value Ref Range   Sodium 136 135 - 145 mmol/L   Potassium 3.8 3.5 - 5.1 mmol/L   Chloride 108 98 - 111 mmol/L   CO2 24 22 - 32 mmol/L   Glucose, Bld 97 70 - 99 mg/dL    Comment: Glucose reference range applies only to samples taken after fasting for at least 8 hours.   BUN 12 6 - 20 mg/dL   Creatinine, Ser 9.07 0.44 - 1.00 mg/dL   Calcium 8.5 (L) 8.9 - 10.3 mg/dL   GFR, Estimated >39 >39  mL/min    Comment: (NOTE) Calculated using the CKD-EPI Creatinine Equation (2021)    Anion gap 4 (L) 5 - 15    Comment: Performed at [facility], [address] [place], [place] [address]     Review of Systems: Patient complains of symptoms per HPI as well as the following symptoms none. Pertinent negatives and positives per HPI. All others negative.   Social History   Socioeconomic History   Marital status: Single    Spouse name: Not on file   Number of children: Not on file   Years of education: Not on file   Highest education level: Not on file  Occupational History   Not on file  Tobacco Use   Smoking status: Never   Smokeless tobacco: Never  Vaping Use   Vaping status: Never Used  Substance and Sexual Activity   Alcohol use: Yes    Comment: every other week   Drug use: Yes    Types: Marijuana   Sexual activity: Not on file  Other Topics Concern   Not on file  Social History Narrative   Katie Le is college grad [employer]. she does well in school. She lives with her mother and brother. She enjoys driving, eating, and listening to music.    Caffiene celsious 1   Working: no.    Social Drivers of Corporate Investment Banker Strain: Not on file  Food Insecurity: No Food Insecurity (10/06/2020)   Received from [facility]   Hunger Vital Sign    Worried About Running Out of Food in the Last Year: Never true    Ran Out of Food in the Last Year: Never true  Transportation Needs: Not on file  Physical Activity: Not on file  Stress: Not on file  Social Connections: Unknown (02/06/2022)   Received from [facility]   Social Network    Social Network: Not on file  Intimate Partner Violence: Unknown (12/29/2021)   Received from [facility]   HITS    Physically Hurt: Not on file    Insult or Talk Down To: Not on file    Threaten Physical Harm: Not on file    Scream or Curse: Not on file    Family History  Problem Relation Age of Onset    Eczema Father    Urticaria Father    Asthma Maternal Grandmother    Allergic rhinitis Mother    Migraines Mother    Angioedema Neg Hx    Immunodeficiency Neg Hx     Past Medical History:  Diagnosis Date   Migraines     Patient Active Problem List   Diagnosis Date Noted   Tension headache 06/09/2018   Migraine without  aura and without status migrainosus, not intractable 06/09/2018   Allergic urticaria 08/02/2016   Allergic rhinoconjunctivitis 08/02/2016    Past Surgical History:  Procedure Laterality Date   MOUTH SURGERY     NO PAST SURGERIES      Current Outpatient Medications  Medication Sig Dispense Refill   Atogepant  (QULIPTA ) 60 MG TABS Take 1 tablet (60 mg total) by mouth daily. 30 tablet 11   azithromycin  (ZITHROMAX ) 250 MG tablet Take 1 tablet (250 mg total) by mouth daily. Take first 2 tablets together, then 1 every day until finished. 6 tablet 0   naproxen  (NAPROSYN ) 500 MG tablet Take 1 tablet (500 mg total) by mouth 2 (two) times daily. 30 tablet 0   Rimegepant Sulfate (NURTEC) 75 MG TBDP Take 1 tablet (75 mg total) by mouth daily as needed. For migraines. Take as close to onset of migraine as possible. One daily maximum. 16 tablet 11   topiramate  (TOPAMAX ) 100 MG tablet Take 100 mg by mouth daily.     No current facility-administered medications for this visit.    Allergies as of 10/03/2023   (No Known Allergies)    Vitals: BP 112/70 (Cuff Size: Normal)   Pulse 87   Ht 5' 4 (1.626 m)   Wt 146 lb 6.4 oz (66.4 kg)   LMP 09/04/2023   BMI 25.13 kg/m  Last Weight:  Wt Readings from Last 1 Encounters:  10/03/23 146 lb 6.4 oz (66.4 kg)   Last Height:   Ht Readings from Last 1 Encounters:  10/03/23 5' 4 (1.626 m)     Physical exam: Exam: Gen: NAD, conversant, well nourised, obese, well groomed                     CV: RRR, no MRG. No Carotid Bruits. No peripheral edema, warm, nontender Eyes: Conjunctivae clear without exudates or  hemorrhage  Neuro: Detailed Neurologic Exam  Speech:    Speech is normal; fluent and spontaneous with normal comprehension.  Cognition:    The patient is oriented to person, place, and time;     recent and remote memory intact;     language fluent;     normal attention, concentration,     fund of knowledge Cranial Nerves:    The pupils are equal, round, and reactive to light. The fundi are normal and spontaneous venous pulsations are present. Visual fields are full to finger confrontation. Extraocular movements are intact. Trigeminal sensation is intact and the muscles of mastication are normal. The face is symmetric. The palate elevates in the midline. Hearing intact. Voice is normal. Shoulder shrug is normal. The tongue has normal motion without fasciculations.   Coordination:    Normal finger to nose and heel to shin. Normal rapid alternating movements.   Gait:    Heel-toe and tandem gait are normal.   Motor Observation:    No asymmetry, no atrophy, and no involuntary movements noted. Tone:    Normal muscle tone.    Posture:    Posture is normal. normal erect    Strength:    Strength is V/V in the upper and lower limbs.      Sensation: intact to LT     Reflex Exam:  DTR's:    Deep tendon reflexes in the upper and lower extremities are normal bilaterally.   Toes:    The toes are downgoing bilaterally.   Clonus:    Clonus is absent.    Assessment/Plan:  23 year old with  migraines  MRi of the brain w/wo contrast: MRI brain due to concerning symptoms of morning headaches, positional headaches,ptosis, loss of consciouseness, vision changes, worsening headaches  to look for space occupying mass, chiari or intracranial hypertension (pseudotumor), strokes, malignancies, vasculidities, demyelination(multiple sclerosis) or other  TSH  Start qulipta  daily for rmigraine prevention  Nurtec daily as needed for migraines Stop topiramate   Orders Placed This Encounter   Procedures   MR BRAIN W WO CONTRAST   TSH Rfx on Abnormal to Free T4   Meds ordered this encounter  Medications   Atogepant  (QULIPTA ) 60 MG TABS    Sig: Take 1 tablet (60 mg total) by mouth daily.    Dispense:  30 tablet    Refill:  11   Rimegepant Sulfate (NURTEC) 75 MG TBDP    Sig: Take 1 tablet (75 mg total) by mouth daily as needed. For migraines. Take as close to onset of migraine as possible. One daily maximum.    Dispense:  16 tablet    Refill:  11    Cc: Katie Clarity, MD,  Patient, No Pcp Per  Onetha Epp, MD  [facility] [address] [place], [place] [address]  Phone [phone] Fax [phone]

## 2023-10-03 NOTE — Patient Instructions (Addendum)
 MRi of the brain w/wo contrast  Thyroid  Start qulipta  daily fo rmigraine prevention Nurtec daily as needed for migraines Stop topiramate   Atogepant  Tablets What is this medication? ATOGEPANT  (a TOE je pant) prevents migraines. It works by blocking a substance in the body that causes migraines. This medicine may be used for other purposes; ask your health care provider or pharmacist if you have questions. COMMON BRAND NAME(S): QULIPTA  What should I tell my care team before I take this medication? They need to know if you have any of these conditions: Kidney disease Liver disease An unusual or allergic reaction to atogepant , other medications, foods, dyes, or preservatives Pregnant or trying to get pregnant Breast-feeding How should I use this medication? Take this medication by mouth with water. Take it as directed on the prescription label at the same time every day. You can take it with or without food. If it upsets your stomach, take it with food. Keep taking it unless your care team tells you to stop. Talk to your care team about the use of this medication in children. Special care may be needed. Overdosage: If you think you have taken too much of this medicine contact a poison control center or emergency room at once. NOTE: This medicine is only for you. Do not share this medicine with others. What if I miss a dose? If you miss a dose, take it as soon as you can. If it is almost time for your next dose, take only that dose. Do not take double or extra doses. What may interact with this medication? Carbamazepine Certain medications for fungal infections, such as itraconazole, ketoconazole Clarithromycin Cyclosporine Efavirenz Etravirine Phenytoin Rifampin St. John's wort This list may not describe all possible interactions. Give your health care provider a list of all the medicines, herbs, non-prescription drugs, or dietary supplements you use. Also tell them if you smoke, drink  alcohol, or use illegal drugs. Some items may interact with your medicine. What should I watch for while using this medication? Visit your care team for regular checks on your progress. Tell your care team if your symptoms do not start to get better or if they get worse. What side effects may I notice from receiving this medication? Side effects that you should report to your care team as soon as possible: Allergic reactions--skin rash, itching, hives, swelling of the face, lips, tongue, or throat Side effects that usually do not require medical attention (report to your care team if they continue or are bothersome): Constipation Fatigue Loss of appetite with weight loss Nausea This list may not describe all possible side effects. Call your doctor for medical advice about side effects. You may report side effects to FDA at 1-800-FDA-1088. Where should I keep my medication? Keep out of the reach of children and pets. Store at room temperature between 20 and 25 degrees C (68 and 77 degrees F). Get rid of any unused medication after the expiration date. To get rid of medications that are no longer needed or have expired: Take the medication to a medication take-back program. Check with your pharmacy or law enforcement to find a location. If you cannot return the medication, check the label or package insert to see if the medication should be thrown out in the garbage or flushed down the toilet. If you are not sure, ask your care team. If it is safe to put it in the trash, take the medication out of the container. Mix the medication with cat litter,  dirt, coffee grounds, or other unwanted substance. Seal the mixture in a bag or container. Put it in the trash. NOTE: This sheet is a summary. It may not cover all possible information. If you have questions about this medicine, talk to your doctor, pharmacist, or health care provider.  2024 Elsevier/Gold Standard (2021-10-30 00:00:00)

## 2023-10-06 MED ORDER — QULIPTA 60 MG PO TABS: 60.0000 mg | ORAL_TABLET | Freq: Every day | ORAL | 11 refills | Status: AC

## 2023-10-06 MED ORDER — NURTEC 75 MG PO TBDP: 75.0000 mg | ORAL_TABLET | Freq: Every day | ORAL | 11 refills | Status: AC | PRN

## 2023-10-07 ENCOUNTER — Telehealth: Payer: Self-pay | Admitting: Neurology

## 2023-10-07 NOTE — Telephone Encounter (Signed)
 [employer] medicaid Berkley Harvey: [id number] exp. 10/07/23-11/21/23 sent to GI [phone]

## 2023-10-15 ENCOUNTER — Telehealth: Payer: Self-pay | Admitting: *Deleted

## 2023-10-15 ENCOUNTER — Telehealth: Payer: Self-pay | Admitting: Neurology

## 2023-10-15 NOTE — Telephone Encounter (Signed)
Pt's mother states the pt is almost out of the sample medication she was given and also hasn't heard anything in regards to labs. Requesting call back

## 2023-10-15 NOTE — Telephone Encounter (Signed)
PA requests sent to PA team for Nurtec & Qulipta. I sent a message to the lab tech to check status on TSH lab as soon as she's back in the office.

## 2023-10-15 NOTE — Telephone Encounter (Signed)
Need Nurtec PA & Qulipta PA as soon as possible please. Pt states almost out of samples. Thank you!

## 2023-10-16 NOTE — Telephone Encounter (Signed)
Per Joni Reining in lab, pt was a difficult stick and she was supposed to come back and get it drawn again.

## 2023-10-16 NOTE — Telephone Encounter (Signed)
Spoke with pt's mother. She was unaware pt needed to return for lab redraw but she will check pt's school schedule and then call us to schedule lab appt. She is aware of office/lab hours. She is aware PAs are pending with PA team.

## 2023-10-17 ENCOUNTER — Other Ambulatory Visit (HOSPITAL_COMMUNITY): Payer: Self-pay

## 2023-10-17 ENCOUNTER — Telehealth: Payer: Self-pay

## 2023-10-17 NOTE — Telephone Encounter (Signed)
Pharmacy Patient Advocate Encounter   Received notification from Physician's Office that prior authorization for Qulipta 60MG  tablets is required/requested.   Insurance verification completed.   The patient is insured through [facility] MEDICAID .   Per test claim: PA required; PA submitted to above mentioned insurance via CoverMyMeds Key/confirmation #/EOC [facility] Status is pending

## 2023-10-17 NOTE — Telephone Encounter (Signed)
Pharmacy Patient Advocate Encounter   Received notification from Physician's Office that prior authorization for Nurtec 75MG  dispersible tablets is required/requested.   Insurance verification completed.   The patient is insured through [facility] MEDICAID .   Per test claim: PA required; PA submitted to above mentioned insurance via CoverMyMeds Key/confirmation #/EOC [id number] Status is pending

## 2023-10-17 NOTE — Telephone Encounter (Signed)
Pharmacy Patient Advocate Encounter  Received notification from [facility] MEDICAID that Prior Authorization for Nurtec 75MG  dispersible tablets has been APPROVED from 10/17/2023 to 10/16/2024. Ran test claim, Copay is $4.00. This test claim was processed through [facility]- copay amounts may vary at other pharmacies due to pharmacy/plan contracts, or as the patient moves through the different stages of their insurance plan.   PA #/Case ID/Reference #: PA Case ID #: [id number]

## 2023-10-17 NOTE — Telephone Encounter (Signed)
PA request has been Submitted. New Encounter created for follow up. For additional info see Pharmacy Prior Auth telephone encounter from 10/17/2023.

## 2023-10-17 NOTE — Telephone Encounter (Signed)
Attempted to call pt.  No answer and could not LM.

## 2023-10-18 NOTE — Telephone Encounter (Signed)
Attemped to call pt, mobile. Could not LM.

## 2023-11-12 ENCOUNTER — Other Ambulatory Visit (HOSPITAL_COMMUNITY): Payer: Self-pay

## 2023-11-12 NOTE — Telephone Encounter (Signed)
I was unable to reach the pt. I called her mother Katie Le (on [place]) and let her know of Qulipta approval. She asked about Nurtec. I told her that one had been approved too. She can call [facility] and ask them to re-run those through insurance. She was very Adult nurse.

## 2023-11-12 NOTE — Telephone Encounter (Signed)
Pharmacy Patient Advocate Encounter  Received notification from [facility] MEDICAID that Prior Authorization for Qulipta 60MG  tablets has been APPROVED from 10/17/2023 to 01/15/2024. Ran test claim, Copay is $4.00. This test claim was processed through [facility]- copay amounts may vary at other pharmacies due to pharmacy/plan contracts, or as the patient moves through the different stages of their insurance plan.   PA #/Case ID/Reference #: PA Case ID #: [id number]

## 2023-11-20 ENCOUNTER — Other Ambulatory Visit: Payer: Medicaid Other

## 2023-12-17 ENCOUNTER — Telehealth: Payer: Self-pay

## 2023-12-17 ENCOUNTER — Other Ambulatory Visit (HOSPITAL_COMMUNITY): Payer: Self-pay

## 2023-12-17 NOTE — Telephone Encounter (Signed)
 I received a CMM faxed request to do a PA renewal for Qulipta-PT has not been evaluated since starting Qulipta. Insurance is asking for documentation for EACH QUESTION below. I can not submit PA until there is documentation answering the questions below in order to avoid a denial as well as to not prolong patient care. (I only answered yes to get all of the clinical questions to populate). Thank you!

## 2023-12-17 NOTE — Telephone Encounter (Signed)
 I called pt, could not LM, I called father of pt. LMVM for him that trying to reach pt about quilipta medication (how she is doing on this renewal of PA).

## 2023-12-18 NOTE — Telephone Encounter (Signed)
 I called pt could not LM, then called mother.  She answered the questions as listed. Pt was doing better with the quilipta.

## 2023-12-18 NOTE — Telephone Encounter (Signed)
 Pharmacy Patient Advocate Encounter   Received notification from CoverMyMeds that prior authorization for Bennie Pierini is required/requested.   Insurance verification completed.   The patient is insured through [facility] MEDICAID .   Per test claim: PA required; PA submitted to above mentioned insurance via CoverMyMeds Key/confirmation #/EOC [id number] Status is pending

## 2023-12-19 ENCOUNTER — Other Ambulatory Visit (HOSPITAL_COMMUNITY): Payer: Self-pay

## 2023-12-19 NOTE — Telephone Encounter (Signed)
 Pharmacy Patient Advocate Encounter  Received notification from [facility] MEDICAID that Prior Authorization for Qulipta 60MG  tablets has been APPROVED from 12/18/2023 to 12/18/2023. Ran test claim, Copay is $4.00. This test claim was processed through [facility]- copay amounts may vary at other pharmacies due to pharmacy/plan contracts, or as the patient moves through the different stages of their insurance plan.   PA #/Case ID/Reference #: PA Case ID #: [id number]

## 2023-12-25 ENCOUNTER — Telehealth: Payer: Self-pay | Admitting: Neurology

## 2023-12-25 NOTE — Telephone Encounter (Signed)
 Spoke to mother of pt.  I relayed that spoke to pharmacy [facility] and they do have refills available. Will fill and will place on autorenewal for future refills.  Mom appreciated call back.

## 2023-12-25 NOTE — Telephone Encounter (Signed)
 I called [facility] pharmacy  and nurtec and qulipta are available 12 refills.  They will get ready.  May place on autorenewal to get notifications.

## 2023-12-25 NOTE — Telephone Encounter (Signed)
 Pt is needing her Atogepant (QULIPTA) 60 MG TABS and her Nurtec called in to the [facility] on [address]

## 2023-12-31 NOTE — Telephone Encounter (Signed)
 [employer] MCD Berkley Harvey: [id number] exp. 02/14/24 for GI

## 2024-01-01 ENCOUNTER — Ambulatory Visit
Admission: RE | Admit: 2024-01-01 | Discharge: 2024-01-01 | Disposition: A | Discharge status: Home / Self Care | Source: Ambulatory Visit | Attending: Neurology

## 2024-01-01 DIAGNOSIS — H5711 Ocular pain, right eye: Secondary | ICD-10-CM

## 2024-01-01 DIAGNOSIS — H539 Unspecified visual disturbance: Secondary | ICD-10-CM

## 2024-01-01 DIAGNOSIS — H02401 Unspecified ptosis of right eyelid: Secondary | ICD-10-CM

## 2024-01-01 DIAGNOSIS — R51 Headache with orthostatic component, not elsewhere classified: Secondary | ICD-10-CM

## 2024-01-01 DIAGNOSIS — R55 Syncope and collapse: Secondary | ICD-10-CM

## 2024-01-01 DIAGNOSIS — R519 Headache, unspecified: Secondary | ICD-10-CM

## 2024-01-01 DIAGNOSIS — R4020 Unspecified coma: Secondary | ICD-10-CM

## 2024-01-01 MED ORDER — GADOPICLENOL 0.5 MMOL/ML IV SOLN
6.0000 mL | Freq: Once | INTRAVENOUS | Status: AC | PRN
  Administered 2024-01-01: 6 mL via INTRAVENOUS

## 2024-01-02 ENCOUNTER — Encounter: Payer: Self-pay | Admitting: Neurology

## 2024-02-11 ENCOUNTER — Encounter: Payer: Self-pay | Admitting: Neurology

## 2024-02-11 ENCOUNTER — Telehealth (INDEPENDENT_AMBULATORY_CARE_PROVIDER_SITE_OTHER): Payer: Medicaid Other | Admitting: Neurology

## 2024-02-11 DIAGNOSIS — G43009 Migraine without aura, not intractable, without status migrainosus: Secondary | ICD-10-CM

## 2024-02-11 NOTE — Progress Notes (Signed)
 [facility]    PRECHARTING, DID NOT CONNECT WITH PATIENT TODAY, PATENT LEFT CALL   02/11/2024: I was running a little late and by the time I logged patient had left.. I tried calling both her numbers and left a message and a mychart. I sent her a mychart message stating what happened Katie Le how she is, and to Lets plan a time we can touch base  02/11/2024: FINDINGS:    No abnormal lesions are seen on diffusion-weighted views to suggest acute ischemia. The cortical sulci, fissures and cisterns are normal in size and appearance. Lateral, third and fourth ventricle are normal in size and appearance. No extra-axial fluid collections are seen. No evidence of mass effect or midline shift.  No abnormal lesions are seen on post contrast views.     On sagittal views the posterior fossa, pituitary gland and corpus callosum are unremarkable. No evidence of intracranial hemorrhage on SWI views. The orbits and their contents, paranasal sinuses and calvarium are unremarkable.  Intracranial flow voids are present.     IMPRESSION:    Normal MRI brain (with and without).       HPI:  Katie Le is a 23 y.o. female here as requested by No ref. provider found for migraines. Seen at [facility] 09/12/2023 for migraines. has Allergic urticaria; Allergic rhinoconjunctivitis; Tension headache; and Migraine without aura and without status migrainosus, not intractable on their problem list.  Mostly on the right. Pulsating/pounding rhobbig. Can be pressure, hurts to lay on it, positional depending on which way she move her head. Can be on either side based on position. Happens in the morning or evenings. Pressure behind the eye. Her eye waters and droops. Light and sound bothers her, mostly light, nausea, has vomited. She ishaving side effects to the topamax  GI problems, cognitive changes. She had a syncopal event, standing and felt hot, had to sit down, she fell on the table, she felt hot, sweaty, she  lost consciousnessness briefy when she came back she threw up. She also had a headache. Started in high school. Mother is here and provides information. She has tried several medications. No migraine aura. For > 6 months she has been having < 14 total headache days a moth of which 6 days are migrainesthat are moderate to severe and can last 8-24 hours or longer, it even hurts to move, worse positionally, vision changes. No medication overuse.   Meds tried > 3 months or contraindicated/side effects: tylenol /ibuprofen  and OTC analgesics, topiramate , amitriptyline, gabapentin,blood pressure meds contraindicated due to hypotension and syncope. Nurtec, ubrelvy, sumatriptan, maxalt, Aimovig contraindicated due to constipation and injectables (Aimovig, Emgality, Ajovy) contraindicated due to vasovagal syncope with injections, Tylenol , benadryl , ibuprofen , toradol , magnesium , naproxen , topamax   Nurtec and the ubrelvy helped.  Reviewed notes, labs and imaging from outside physicians, which showed: Reviewed notes from ED visit  09/12/2023 Dr. Sueellen Emery MD :  Pt is a 23 yo female with pmhx significant for migraines.  Pt said she's had a headache intermittently for 3 weeks.  She also said she passed out a few days ago.  She takes topamax  for her headaches, but does not think that is helping.  This headache started this am around 0230.  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 (No imaging ordered)   No results found for this or any previous visit (from the past 2160 hours).    Review of Systems: Patient complains of symptoms per HPI as well as the  following symptoms none. Pertinent negatives and positives per HPI. All others negative.   Social History   Socioeconomic History   Marital status: Single    Spouse name: Not on file   Number of children: Not on file   Years of education: Not on file   Highest education level: Not on file  Occupational History   Not on file  Tobacco  Use   Smoking status: Never   Smokeless tobacco: Never  Vaping Use   Vaping status: Never Used  Substance and Sexual Activity   Alcohol use: Yes    Comment: every other week   Drug use: Yes    Types: Marijuana   Sexual activity: Not on file  Other Topics Concern   Not on file  Social History Narrative   Katie Le is college grad [employer]. she does well in school. She lives with her mother and brother. She enjoys driving, eating, and listening to music.    Caffiene celsious 1   Working: no.    Social Drivers of Corporate investment banker Strain: Not on file  Food Insecurity: No Food Insecurity (10/06/2020)   Received from [facility]   Hunger Vital Sign    Worried About Running Out of Food in the Last Year: Never true    Ran Out of Food in the Last Year: Never true  Transportation Needs: Not on file  Physical Activity: Not on file  Stress: Not on file  Social Connections: Unknown (02/06/2022)   Received from [facility]   Social Network    Social Network: Not on file  Intimate Partner Violence: Unknown (12/29/2021)   Received from [facility]   HITS    Physically Hurt: Not on file    Insult or Talk Down To: Not on file    Threaten Physical Harm: Not on file    Scream or Curse: Not on file    Family History  Problem Relation Age of Onset   Eczema Father    Urticaria Father    Asthma Maternal Grandmother    Allergic rhinitis Mother    Migraines Mother    Angioedema Neg Hx    Immunodeficiency Neg Hx     Past Medical History:  Diagnosis Date   Migraines     Patient Active Problem List   Diagnosis Date Noted   Tension headache 06/09/2018   Migraine without aura and without status migrainosus, not intractable 06/09/2018   Allergic urticaria 08/02/2016   Allergic rhinoconjunctivitis 08/02/2016    Past Surgical History:  Procedure Laterality Date   MOUTH SURGERY     NO PAST SURGERIES      Current Outpatient Medications  Medication Sig Dispense Refill    Atogepant  (QULIPTA ) 60 MG TABS Take 1 tablet (60 mg total) by mouth daily. 30 tablet 11   azithromycin  (ZITHROMAX ) 250 MG tablet Take 1 tablet (250 mg total) by mouth daily. Take first 2 tablets together, then 1 every day until finished. 6 tablet 0   naproxen  (NAPROSYN ) 500 MG tablet Take 1 tablet (500 mg total) by mouth 2 (two) times daily. 30 tablet 0   Rimegepant Sulfate (NURTEC) 75 MG TBDP Take 1 tablet (75 mg total) by mouth daily as needed. For migraines. Take as close to onset of migraine as possible. One daily maximum. 16 tablet 11   topiramate  (TOPAMAX ) 100 MG tablet Take 100 mg by mouth daily.     No current facility-administered medications for this visit.    Allergies as of  02/11/2024   (No Known Allergies)    Vitals: There were no vitals taken for this visit. Last Weight:  Wt Readings from Last 1 Encounters:  10/03/23 146 lb 6.4 oz (66.4 kg)   Last Height:   Ht Readings from Last 1 Encounters:  10/03/23 5\' 4"  (1.626 m)    PRECHARTING, DID NOT CONNECT WITH PATIENT TODAY   Assessment/Plan:  23 year old with migraines  MRi of the brain w/wo contrast: MRI brain due to concerning symptoms of morning headaches, positional headaches,ptosis, loss of consciouseness, vision changes, worsening headaches  to look for space occupying mass, chiari or intracranial hypertension (pseudotumor), strokes, malignancies, vasculidities, demyelination(multiple sclerosis) or other  TSH  Start qulipta  daily for rmigraine prevention  Nurtec daily as needed for migraines Stop topiramate   No orders of the defined types were placed in this encounter.  No orders of the defined types were placed in this encounter.   Cc: No ref. provider found,  Patient, No Pcp Per  Aldona Amel, MD  [facility] [address] [place], [place] [address]  Phone [phone] Fax [phone]

## 2024-04-14 ENCOUNTER — Telehealth: Payer: Self-pay | Admitting: *Deleted

## 2024-04-14 ENCOUNTER — Telehealth: Payer: Self-pay | Admitting: Neurology

## 2024-04-14 NOTE — Telephone Encounter (Signed)
 Spoke to mom (checked DPR) per pharmacy if no issues with shipment Qulipta  and Nurtec will be ready for pick up tomorrow. Mom thanked me for calling

## 2024-04-14 NOTE — Telephone Encounter (Signed)
 Spoke to patient per pharmacy nurtec will be ready tomorrow. Per Pharmacy no PA nurtec is needed Pt thanked me for calling

## 2024-04-14 NOTE — Telephone Encounter (Signed)
 Pt's mother called wanting to speak to someone that can give her answers because her daughter has been waiting for her Nurtec and she keeps getting a different story from the pharmacy and the office. Please call back when avail.

## 2024-09-18 ENCOUNTER — Telehealth: Payer: Self-pay | Admitting: Pharmacist

## 2024-09-18 NOTE — Telephone Encounter (Signed)
 Pharmacy Patient Advocate Encounter   Received notification from CoverMyMeds that prior authorization for Nurtec 75MG  dispersible tablets is required/requested.   Insurance verification completed.   The patient is insured through [facility].   Per test claim: PA required; PA submitted to above mentioned insurance via Latent Key/confirmation #/EOC [id number] Status is pending

## 2024-09-18 NOTE — Telephone Encounter (Signed)
 Pharmacy Patient Advocate Encounter  Received notification from [facility] that Prior Authorization for Rimegepant Sulfate (NURTEC) 75 MG TBDP has been APPROVED from 09/18/2024 to 09/18/2025   PA #/Case ID/Reference #: [id number]
